# Patient Record
Sex: Male | Born: 1965 | Race: White | Hispanic: No | Marital: Single | Smoking: Current every day smoker
Health system: Southern US, Community
[De-identification: ages and names within clinical notes are randomized; demographics above are authoritative.]

## PROBLEM LIST (undated history)

## (undated) HISTORY — PX: LEG SURGERY: SHX1003

---

## 2018-09-11 ENCOUNTER — Emergency Department
Admission: EM | Admit: 2018-09-11 | Discharge: 2018-09-11 | Disposition: A | Discharge status: Home / Self Care | Payer: Self-pay | Source: Home / Self Care

## 2018-09-11 ENCOUNTER — Encounter: Payer: Self-pay | Admitting: *Deleted

## 2018-09-11 ENCOUNTER — Emergency Department (INDEPENDENT_AMBULATORY_CARE_PROVIDER_SITE_OTHER): Payer: Self-pay

## 2018-09-11 ENCOUNTER — Other Ambulatory Visit: Payer: Self-pay

## 2018-09-11 DIAGNOSIS — J111 Influenza due to unidentified influenza virus with other respiratory manifestations: Secondary | ICD-10-CM

## 2018-09-11 DIAGNOSIS — R059 Cough, unspecified: Secondary | ICD-10-CM

## 2018-09-11 DIAGNOSIS — J439 Emphysema, unspecified: Secondary | ICD-10-CM

## 2018-09-11 DIAGNOSIS — R062 Wheezing: Secondary | ICD-10-CM

## 2018-09-11 DIAGNOSIS — R69 Illness, unspecified: Secondary | ICD-10-CM

## 2018-09-11 DIAGNOSIS — I7090 Unspecified atherosclerosis: Secondary | ICD-10-CM

## 2018-09-11 DIAGNOSIS — R05 Cough: Secondary | ICD-10-CM

## 2018-09-11 MED ORDER — ALBUTEROL SULFATE HFA 108 (90 BASE) MCG/ACT IN AERS: 2.0000 | INHALATION_SPRAY | RESPIRATORY_TRACT | 1 refills | Status: AC | PRN

## 2018-09-11 MED ORDER — PREDNISONE 20 MG PO TABS: ORAL_TABLET | ORAL | 0 refills | Status: AC

## 2018-09-11 MED ORDER — AZITHROMYCIN 250 MG PO TABS: 250.0000 mg | ORAL_TABLET | Freq: Every day | ORAL | 0 refills | Status: AC
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
## 2018-09-11 NOTE — Discharge Instructions (Addendum)
Drink plenty of fluids and get enough rest  Use the albuterol inhaler 2 inhalations every 4-6 hours as needed for coughing or wheezing or tightness in chest.  Take Mucinex DM for the cough, and it should help loosen up the cough some.  Take prednisone 20 mg 3 pills daily for 2 days, then 2 daily for 2 days, then 1 daily for 2 days  Take Zithromax (azithromycin) 2 pills initially, then 1 daily for 4 days  Work on stopping smoking  Advise annual flu shots.  Return if worse

## 2018-09-11 NOTE — ED Triage Notes (Signed)
Patient c/o dry cough with chest tightness and aches x 1 week. Afebrile. Taken day and nyquil

## 2020-02-22 IMAGING — DX DG CHEST 2V
2 series · 2 of 2 positions shown · non-contrast
Comparison: None.

CLINICAL DATA: Cough and wheezing for 1 week in a cigarette smoker.

EXAM:
CHEST - 2 VIEW

[chest pa]
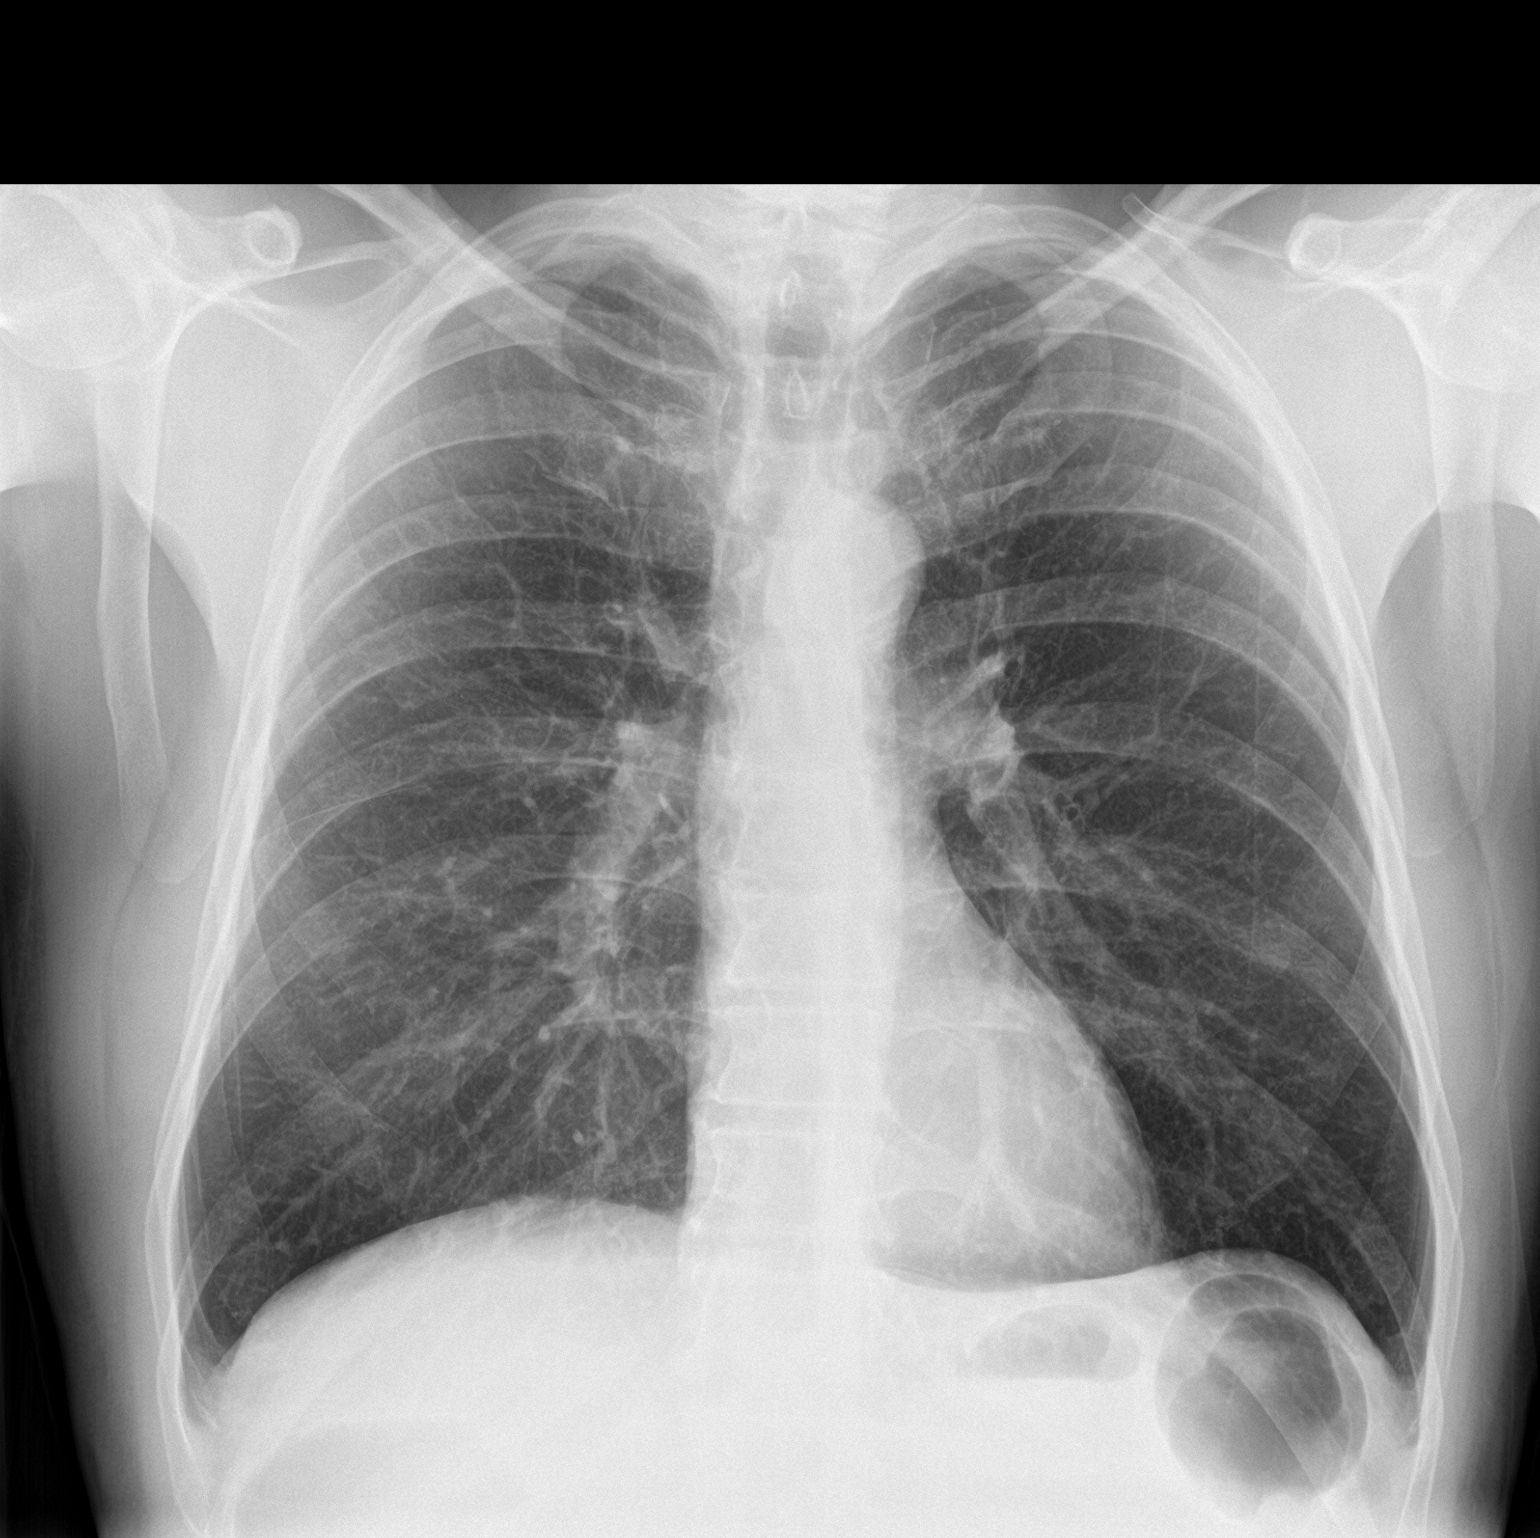

[chest lat]
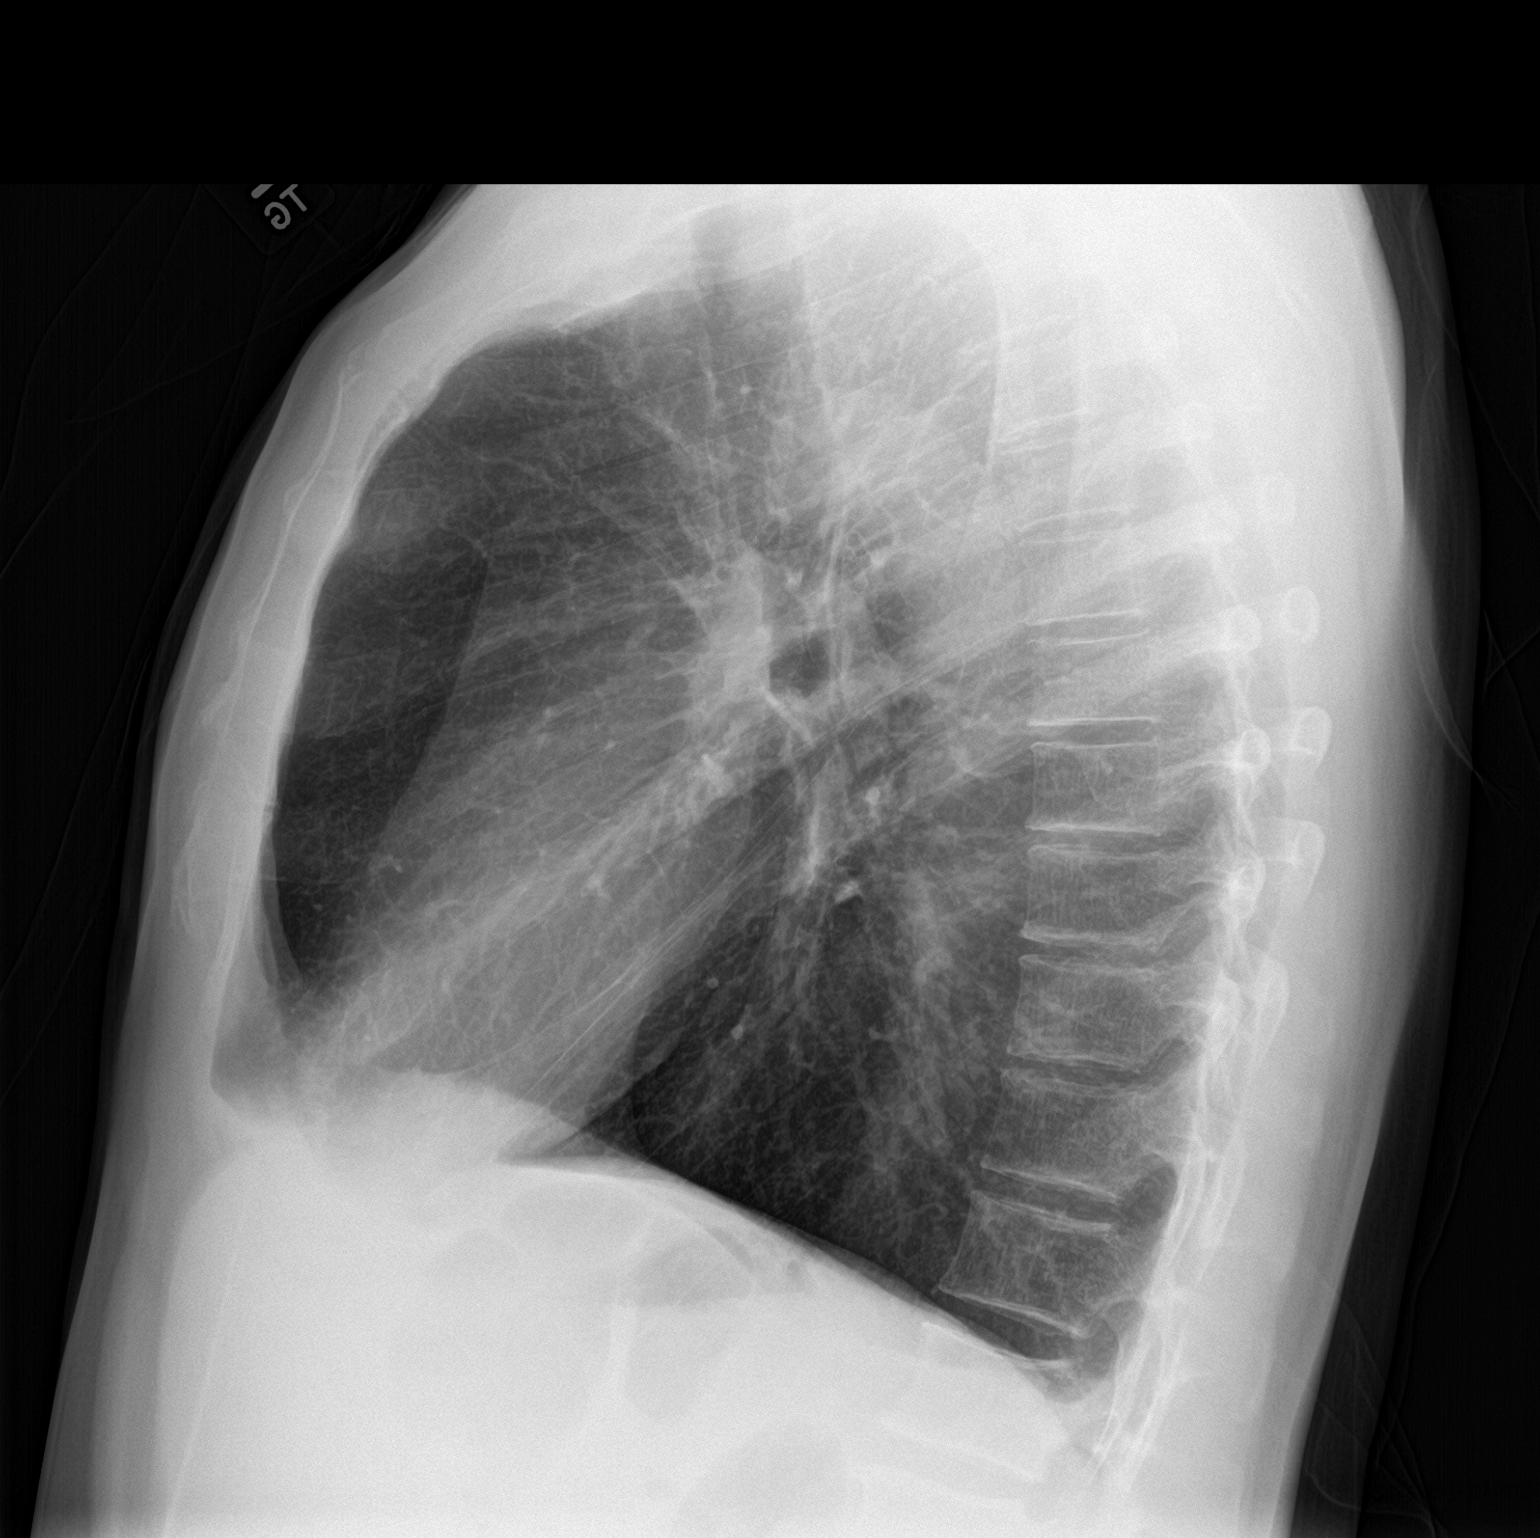

[2 of 2 positions shown; findings below may reference images not displayed]

FINDINGS: The chest is hyperexpanded with enlargement of the retrosternal
airspace and flattening of the hemidiaphragms consistent with
emphysema. The lungs are clear. Heart size is normal. Aortic
atherosclerosis is noted. No pneumothorax or pleural effusion. No
bony abnormality.
IMPRESSION: No acute disease.

Emphysema.

Atherosclerosis.
